# Patient Record
Sex: Female | Born: 1960 | Race: White | Hispanic: No | Marital: Single | Smoking: Never smoker
Health system: Southern US, Community
[De-identification: ages and names within clinical notes are randomized; demographics above are authoritative.]

## PROBLEM LIST (undated history)

## (undated) DIAGNOSIS — E785 Hyperlipidemia, unspecified: Secondary | ICD-10-CM

## (undated) HISTORY — PX: BACK SURGERY: SHX140

## (undated) HISTORY — PX: CHOLECYSTECTOMY: SHX55

---

## 2000-07-25 ENCOUNTER — Other Ambulatory Visit: Admission: RE | Admit: 2000-07-25 | Discharge: 2000-07-25 | Payer: Self-pay | Admitting: Internal Medicine

## 2003-11-23 ENCOUNTER — Other Ambulatory Visit: Admission: RE | Admit: 2003-11-23 | Discharge: 2003-11-23 | Payer: Self-pay | Admitting: Obstetrics and Gynecology

## 2004-12-26 ENCOUNTER — Observation Stay (HOSPITAL_COMMUNITY): Admission: RE | Admit: 2004-12-26 | Discharge: 2004-12-27 | Payer: Self-pay | Admitting: Orthopedic Surgery

## 2004-12-26 ENCOUNTER — Encounter (INDEPENDENT_AMBULATORY_CARE_PROVIDER_SITE_OTHER): Payer: Self-pay | Admitting: Specialist

## 2005-06-19 ENCOUNTER — Ambulatory Visit: Payer: Self-pay | Admitting: General Practice

## 2006-01-01 ENCOUNTER — Other Ambulatory Visit: Admission: RE | Admit: 2006-01-01 | Discharge: 2006-01-01 | Payer: Self-pay | Admitting: Family Medicine

## 2006-07-03 ENCOUNTER — Ambulatory Visit: Payer: Self-pay | Admitting: General Practice

## 2007-07-08 ENCOUNTER — Ambulatory Visit: Payer: Self-pay | Admitting: General Practice

## 2007-07-10 ENCOUNTER — Ambulatory Visit: Payer: Self-pay | Admitting: General Practice

## 2008-03-03 ENCOUNTER — Other Ambulatory Visit: Admission: RE | Admit: 2008-03-03 | Discharge: 2008-03-03 | Payer: Self-pay | Admitting: Family Medicine

## 2008-03-15 ENCOUNTER — Ambulatory Visit: Payer: Self-pay | Admitting: General Practice

## 2008-07-14 ENCOUNTER — Ambulatory Visit: Payer: Self-pay | Admitting: General Practice

## 2009-04-26 ENCOUNTER — Other Ambulatory Visit: Admission: RE | Admit: 2009-04-26 | Discharge: 2009-04-26 | Payer: Self-pay | Admitting: Family Medicine

## 2009-07-20 ENCOUNTER — Ambulatory Visit: Payer: Self-pay | Admitting: General Practice

## 2010-07-12 ENCOUNTER — Ambulatory Visit: Payer: Self-pay | Admitting: General Practice

## 2010-11-14 ENCOUNTER — Other Ambulatory Visit: Payer: Self-pay | Admitting: Family Medicine

## 2010-11-14 DIAGNOSIS — R51 Headache: Secondary | ICD-10-CM

## 2010-11-17 ENCOUNTER — Ambulatory Visit
Admission: RE | Admit: 2010-11-17 | Discharge: 2010-11-17 | Disposition: A | Discharge status: Home / Self Care | Payer: BC Managed Care – PPO | Source: Ambulatory Visit | Attending: Family Medicine | Admitting: Family Medicine

## 2010-11-17 DIAGNOSIS — R51 Headache: Secondary | ICD-10-CM

## 2010-11-17 MED ORDER — IOHEXOL 300 MG/ML  SOLN
75.0000 mL | Freq: Once | INTRAMUSCULAR | Status: AC | PRN
  Administered 2010-11-17: 75 mL via INTRAVENOUS

## 2011-01-30 ENCOUNTER — Other Ambulatory Visit (HOSPITAL_COMMUNITY)
Admission: RE | Admit: 2011-01-30 | Discharge: 2011-01-30 | Disposition: A | Discharge status: Home / Self Care | Payer: BC Managed Care – PPO | Source: Ambulatory Visit | Attending: Family Medicine | Admitting: Family Medicine

## 2011-01-30 ENCOUNTER — Other Ambulatory Visit: Payer: Self-pay | Admitting: Family Medicine

## 2011-01-30 DIAGNOSIS — Z1159 Encounter for screening for other viral diseases: Secondary | ICD-10-CM | POA: Insufficient documentation

## 2011-01-30 DIAGNOSIS — Z124 Encounter for screening for malignant neoplasm of cervix: Secondary | ICD-10-CM | POA: Insufficient documentation

## 2011-02-02 NOTE — Op Note (Signed)
Kimberly Maxwell, Kimberly Maxwell                 ACCOUNT NO.:  [id number]   MEDICAL RECORD NO.:  [id number]          PATIENT TYPE:  AMB   LOCATION:  DAY                          FACILITY:  [facility]   PHYSICIAN:  Marlowe Kays, M.D.  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   OPERATION:  Microdiscectomy and removal of free fragments L3-4 right.   SURGEON:  Marlowe Kays, M.D.   ASSISTANT:  Georges Lynch. Darrelyn Hillock, M.D.   ANESTHESIA:  General anesthesia.   INDICATIONS FOR PROCEDURE:  She has had prolonged back and right leg pain  associated with weakness of her quadriceps and numbness over the pretibial  area.  MRI demonstrated a lateral recess free fragment disc material over  the body of L4 distally.  Because of the pain and neurologic deficit, she is  here today for surgery.   DESCRIPTION OF PROCEDURE:  Prophylactic antibiotics.  Satisfactory general  anesthesia.  Prone position on the [place] frame.  Back was prepped with  DuraPrep and with three spinal needles and a lateral x-ray, tentatively  located the L3-4 interspace.  Continued to drape her back in a sterile  field.  A midline incision based on the original x-ray and isolated what  looked to be the spinous process of L3 and L4.  These were tagged with  Kocher clamps and a second lateral x-ray was taken confirming the L3-4  interspace with a superior clamp pointed at the L3-4 disc.  Based on this, I  dissected the soft tissue off the lamina of L3 and L4.  I used 2 and 3 mm  Kerrison rongeurs to undermine the inferior lamina of L3 and after using a  small curette to undermine the superior portion of L4, used them there as  well, working laterally, in addition to perform the initial  decompression.  I then brought in a microscope which showed significant lateral recess  stenosis and we removed additional bone laterally and performed a wide  foraminotomy.  I then mobilized the meninges and the L4 nerve root medially.  A large amount of disc material was found above and beneath the L4 nerve  root just as depicted on the MRI.  I removed this with nerve hook and  pituitary rongeur.  A number of bleeders were coagulated with bipolar  cautery.  After removing all disc fragments at this location, I then used 3  mm Kerrison rongeur to remove the inferior lamina of L3, I worked up to the  L3-4 disc.  We then retracted the dura at this level.  I opened the disc  space with a 15 knife blade.  We got out a moderate amount of disc material.  It appeared that most of it had already been spitted out as part of the free  fragment. We removed all disc material obtainable.  All bleeders  were  corrected, rechecked with a hockey stick to make sure the L4 nerve root was  lying free in the foramen and that there was no additional disc material  beneath the meninges.  I then irrigated the wound well, placed Gelfoam in  the bed of the wound and closed, removed the self-retaining retractors.  There was no unusual bleeding.  She was given 30 mg of Toradol IV.  The  fascia was closed with interrupted #1 Vicryl, subcutaneous tissue with a  combination of #1 deep 2-0 Vicryl superficially, infiltrated the soft  tissues with 0.5% plain Marcaine and continued the closure with 2-0 Vicryl  superficially and staples in the  skin.  Betadine, Adaptic and dry sterile dressing applied.  She tolerated  the procedure well and was taken to the recovery room in satisfactory  condition with no known complications.   ESTIMATED BLOOD LOSS:  Perhaps 100 mL.  Total blood replacement.      JA/MEDQ  D:  12/26/2004  T:  12/26/2004  Job:  [id number]

## 2011-02-22 ENCOUNTER — Other Ambulatory Visit: Payer: Self-pay | Admitting: Gastroenterology

## 2011-07-11 ENCOUNTER — Ambulatory Visit: Payer: Self-pay

## 2014-12-30 ENCOUNTER — Other Ambulatory Visit: Payer: Self-pay

## 2014-12-30 DIAGNOSIS — Z1231 Encounter for screening mammogram for malignant neoplasm of breast: Secondary | ICD-10-CM

## 2015-01-10 ENCOUNTER — Ambulatory Visit
Admission: RE | Admit: 2015-01-10 | Discharge: 2015-01-10 | Disposition: A | Discharge status: Home / Self Care | Payer: 59 | Source: Ambulatory Visit

## 2015-01-10 DIAGNOSIS — Z1231 Encounter for screening mammogram for malignant neoplasm of breast: Secondary | ICD-10-CM

## 2016-01-02 DIAGNOSIS — E782 Mixed hyperlipidemia: Secondary | ICD-10-CM | POA: Diagnosis not present

## 2016-01-02 DIAGNOSIS — N183 Chronic kidney disease, stage 3 (moderate): Secondary | ICD-10-CM | POA: Diagnosis not present

## 2016-01-02 DIAGNOSIS — I129 Hypertensive chronic kidney disease with stage 1 through stage 4 chronic kidney disease, or unspecified chronic kidney disease: Secondary | ICD-10-CM | POA: Diagnosis not present

## 2016-01-02 DIAGNOSIS — G2581 Restless legs syndrome: Secondary | ICD-10-CM | POA: Diagnosis not present

## 2016-05-11 DIAGNOSIS — R1030 Lower abdominal pain, unspecified: Secondary | ICD-10-CM | POA: Diagnosis not present

## 2016-05-11 DIAGNOSIS — K5792 Diverticulitis of intestine, part unspecified, without perforation or abscess without bleeding: Secondary | ICD-10-CM | POA: Diagnosis not present

## 2016-05-11 DIAGNOSIS — M545 Low back pain: Secondary | ICD-10-CM | POA: Diagnosis not present

## 2016-05-11 DIAGNOSIS — R3129 Other microscopic hematuria: Secondary | ICD-10-CM | POA: Diagnosis not present

## 2016-05-25 ENCOUNTER — Other Ambulatory Visit: Payer: Self-pay | Admitting: Family Medicine

## 2016-05-25 ENCOUNTER — Ambulatory Visit
Admission: RE | Admit: 2016-05-25 | Discharge: 2016-05-25 | Disposition: A | Discharge status: Home / Self Care | Payer: BLUE CROSS/BLUE SHIELD | Source: Ambulatory Visit | Attending: Family Medicine | Admitting: Family Medicine

## 2016-05-25 DIAGNOSIS — R1032 Left lower quadrant pain: Secondary | ICD-10-CM

## 2016-05-25 DIAGNOSIS — K5732 Diverticulitis of large intestine without perforation or abscess without bleeding: Secondary | ICD-10-CM | POA: Diagnosis not present

## 2016-06-01 DIAGNOSIS — M5441 Lumbago with sciatica, right side: Secondary | ICD-10-CM | POA: Diagnosis not present

## 2016-07-30 ENCOUNTER — Other Ambulatory Visit: Payer: Self-pay | Admitting: Family Medicine

## 2016-07-30 ENCOUNTER — Other Ambulatory Visit (HOSPITAL_COMMUNITY)
Admission: RE | Admit: 2016-07-30 | Discharge: 2016-07-30 | Disposition: A | Discharge status: Home / Self Care | Payer: BLUE CROSS/BLUE SHIELD | Source: Ambulatory Visit | Attending: Family Medicine | Admitting: Family Medicine

## 2016-07-30 DIAGNOSIS — Z124 Encounter for screening for malignant neoplasm of cervix: Secondary | ICD-10-CM | POA: Diagnosis not present

## 2016-07-30 DIAGNOSIS — N183 Chronic kidney disease, stage 3 (moderate): Secondary | ICD-10-CM | POA: Diagnosis not present

## 2016-07-30 DIAGNOSIS — Z23 Encounter for immunization: Secondary | ICD-10-CM | POA: Diagnosis not present

## 2016-07-30 DIAGNOSIS — I129 Hypertensive chronic kidney disease with stage 1 through stage 4 chronic kidney disease, or unspecified chronic kidney disease: Secondary | ICD-10-CM | POA: Diagnosis not present

## 2016-07-30 DIAGNOSIS — Z0000 Encounter for general adult medical examination without abnormal findings: Secondary | ICD-10-CM | POA: Diagnosis not present

## 2016-07-30 DIAGNOSIS — K219 Gastro-esophageal reflux disease without esophagitis: Secondary | ICD-10-CM | POA: Diagnosis not present

## 2016-07-30 DIAGNOSIS — E782 Mixed hyperlipidemia: Secondary | ICD-10-CM | POA: Diagnosis not present

## 2016-07-30 DIAGNOSIS — Z01419 Encounter for gynecological examination (general) (routine) without abnormal findings: Secondary | ICD-10-CM | POA: Insufficient documentation

## 2016-08-01 LAB — CYTOLOGY - PAP: DIAGNOSIS: NEGATIVE

## 2016-08-13 DIAGNOSIS — H1851 Endothelial corneal dystrophy: Secondary | ICD-10-CM | POA: Diagnosis not present

## 2016-08-13 DIAGNOSIS — H2513 Age-related nuclear cataract, bilateral: Secondary | ICD-10-CM | POA: Diagnosis not present

## 2016-08-29 DIAGNOSIS — H43813 Vitreous degeneration, bilateral: Secondary | ICD-10-CM | POA: Diagnosis not present

## 2016-08-29 DIAGNOSIS — H2513 Age-related nuclear cataract, bilateral: Secondary | ICD-10-CM | POA: Diagnosis not present

## 2016-08-29 DIAGNOSIS — H353190 Nonexudative age-related macular degeneration, unspecified eye, stage unspecified: Secondary | ICD-10-CM | POA: Diagnosis not present

## 2016-08-29 DIAGNOSIS — H353132 Nonexudative age-related macular degeneration, bilateral, intermediate dry stage: Secondary | ICD-10-CM | POA: Diagnosis not present

## 2016-08-29 DIAGNOSIS — I10 Essential (primary) hypertension: Secondary | ICD-10-CM | POA: Diagnosis not present

## 2016-08-29 DIAGNOSIS — H1851 Endothelial corneal dystrophy: Secondary | ICD-10-CM | POA: Diagnosis not present

## 2016-08-29 DIAGNOSIS — H547 Unspecified visual loss: Secondary | ICD-10-CM | POA: Diagnosis not present

## 2016-08-29 DIAGNOSIS — Z79899 Other long term (current) drug therapy: Secondary | ICD-10-CM | POA: Diagnosis not present

## 2016-10-03 IMAGING — CT CT ABD-PELV W/O CM
2 of 4 series · 16 of 46 positions shown, 18 images · non-contrast
Comparison: None.

CLINICAL DATA: Left lower quadrant pain, right back pain, history
diverticulitis with 3 weeks of antibiotics

EXAM:
CT ABDOMEN AND PELVIS WITHOUT CONTRAST
TECHNIQUE: Multidetector CT imaging of the abdomen and pelvis was performed
following the standard protocol without IV contrast.

[Series 2: abd/pelvis w/(date) · axial · 0.76mm/px · z∈[-451,+34]mm · 13 of 107 slices shown, 15 images]
[im 5/107  soft-tissue]
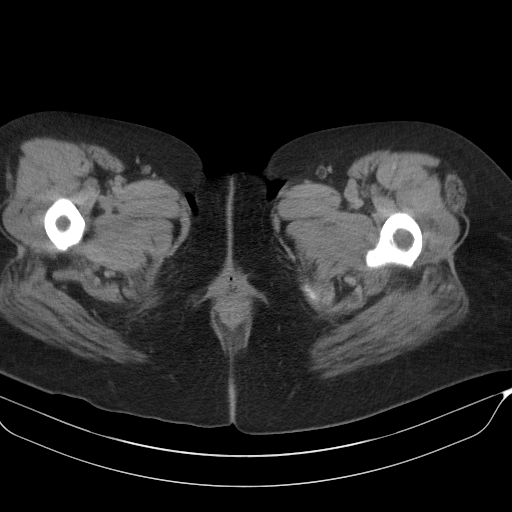
[im 5/107  bone]
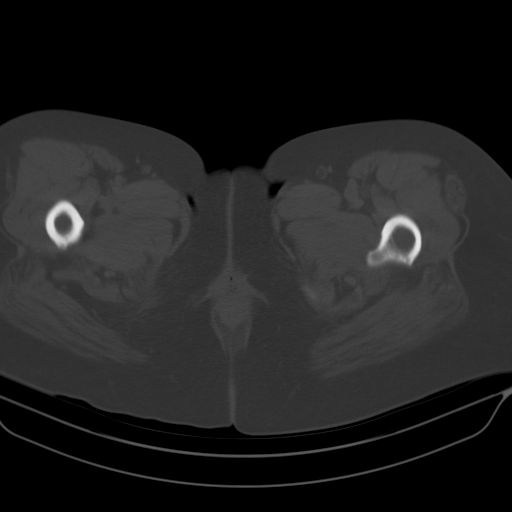
[im 13/107  soft-tissue]
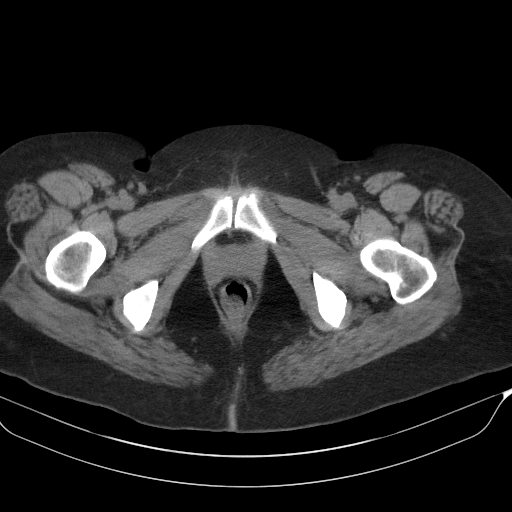
[im 22/107  soft-tissue]
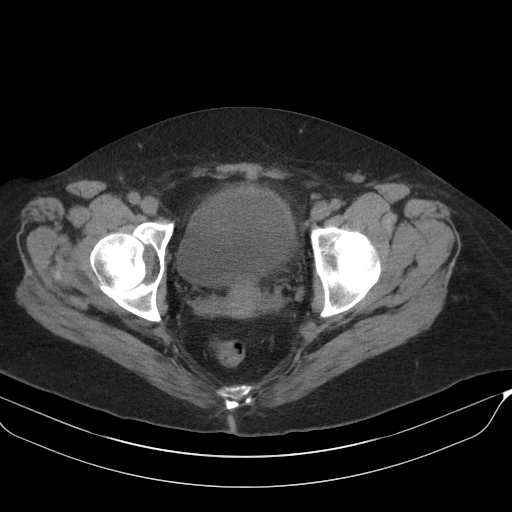
[im 30/107  soft-tissue]
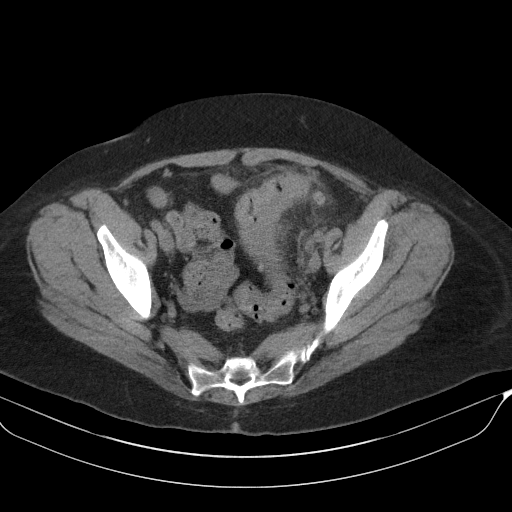
[im 39/107  soft-tissue]
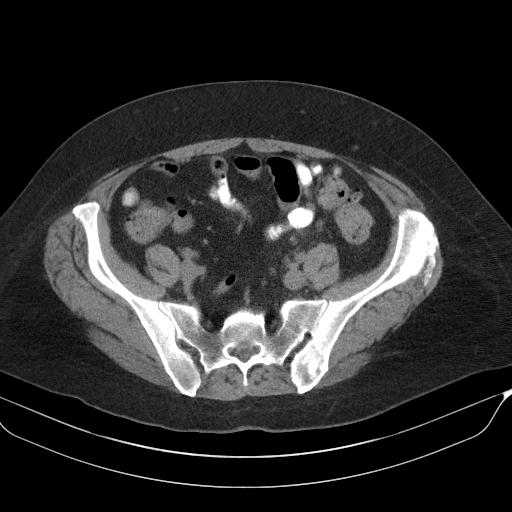
[im 47/107  soft-tissue]
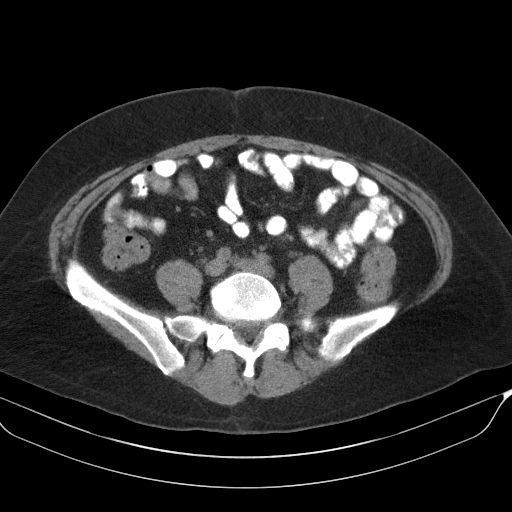
[im 56/107  soft-tissue]
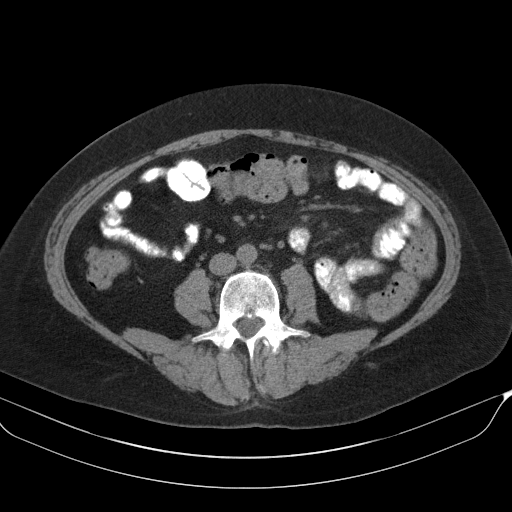
[im 60/107  soft-tissue]
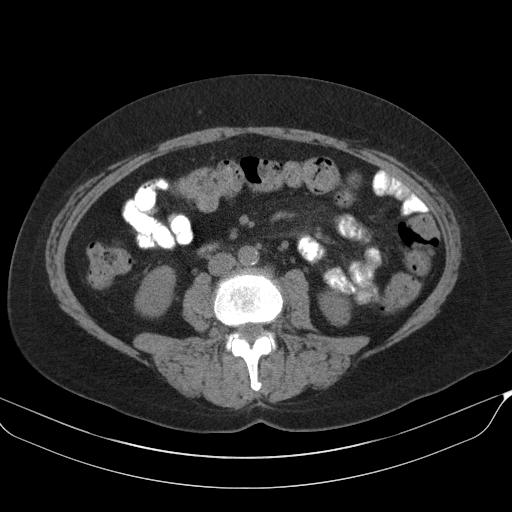
[im 68/107  soft-tissue]
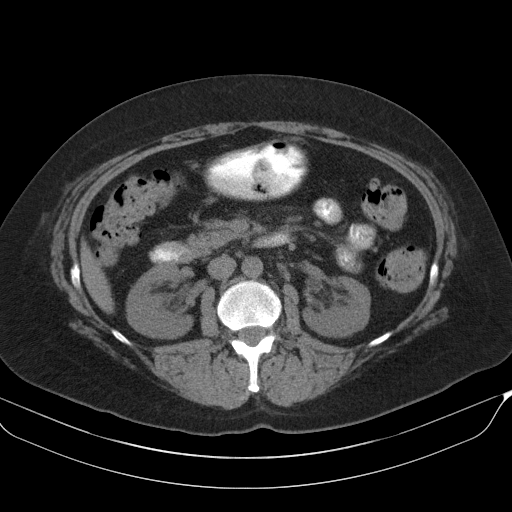
[im 68/107  bone]
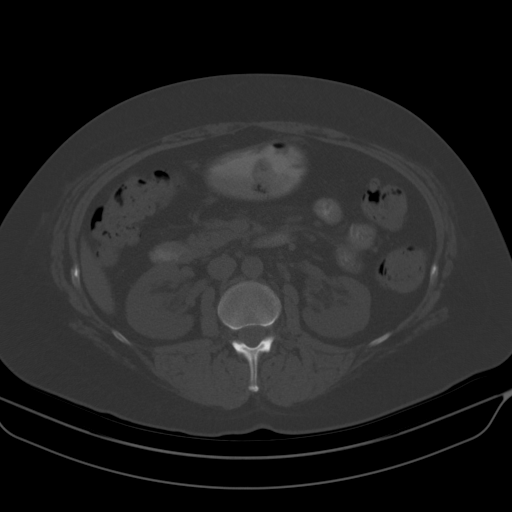
[im 77/107  soft-tissue]
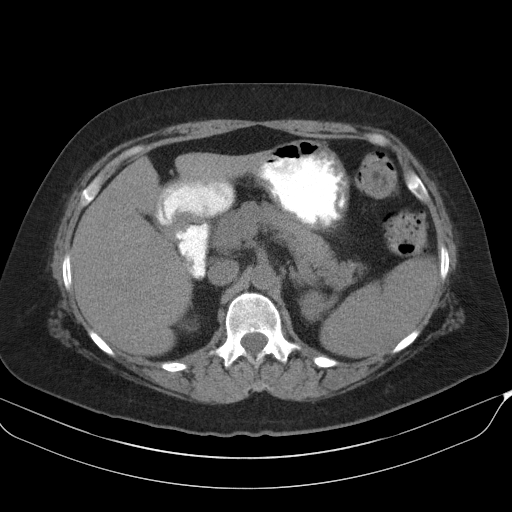
[im 85/107  soft-tissue]
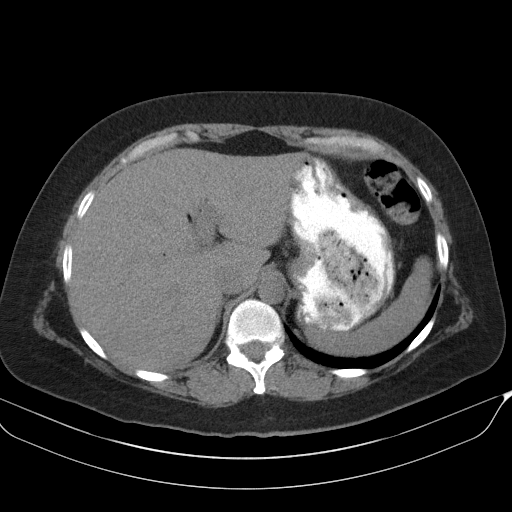
[im 94/107  soft-tissue]
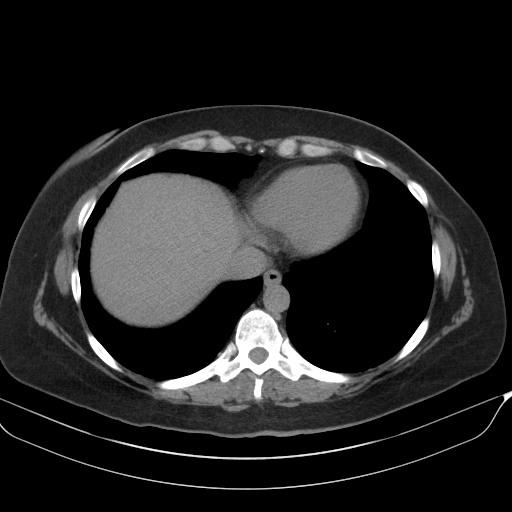
[im 102/107  soft-tissue]
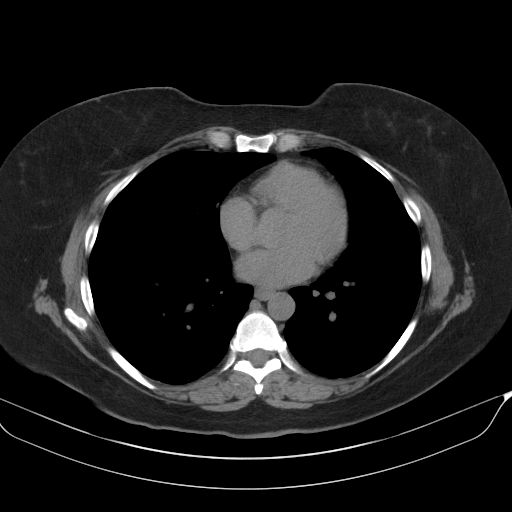

[Series 3: cor · coronal · 0.77mm/px · 3 of 82 slices shown]
[im 28/82  soft-tissue]
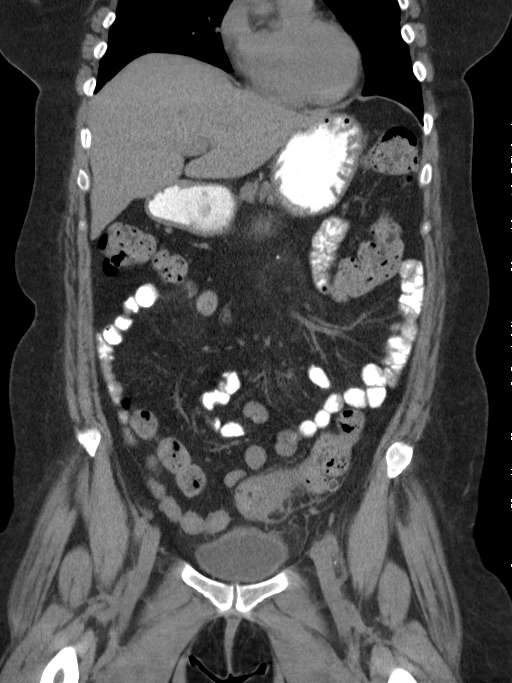
[im 37/82  soft-tissue]
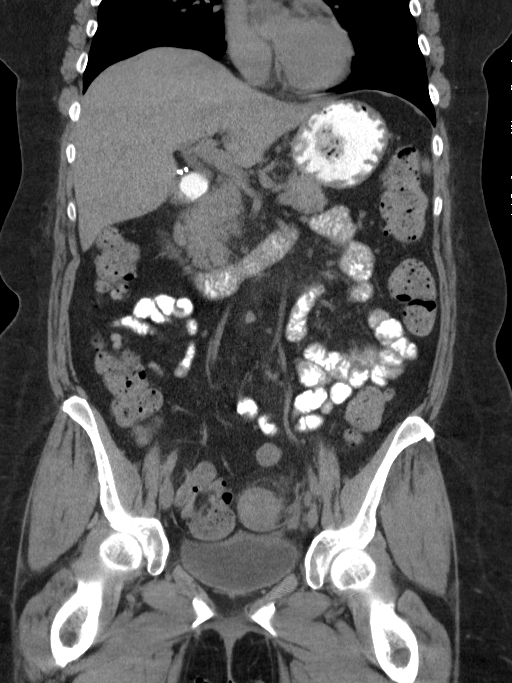
[im 46/82  soft-tissue]
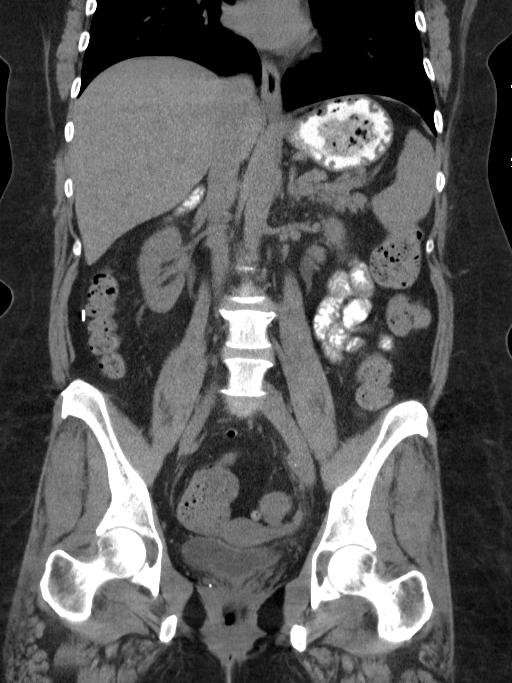

[16 of 46 positions shown; findings below may reference images not displayed]

FINDINGS: Lower chest: No acute abnormality.

Hepatobiliary: No focal liver abnormality is seen. Status post
cholecystectomy. No biliary dilatation.

Pancreas: Unremarkable. No pancreatic ductal dilatation or
surrounding inflammatory changes.

Spleen: Normal in size without focal abnormality.

Adrenals/Urinary Tract: Adrenal glands are unremarkable. Kidneys are
normal, without renal calculi, focal lesion, or hydronephrosis.
Bladder is unremarkable.

Stomach/Bowel: No bowel dilatation. Diverticulosis of the sigmoid
colon with bowel wall thickening and pericolonic inflammatory
changes most consistent with acute diverticulitis. No
peridiverticular abscess. No pneumatosis, pneumoperitoneum or portal
venous gas. No abdominal or pelvic free fluid.

Vascular/Lymphatic: Abdominal aortic atherosclerosis. Normal caliber
abdominal aorta. No lymphadenopathy.

Reproductive: Uterus and bilateral adnexa are unremarkable.

Other: No abdominal wall hernia or abnormality. No abdominopelvic
ascites.

Musculoskeletal: No acute or significant osseous findings. No lytic
or sclerotic osseous lesion.
IMPRESSION: 1. Acute sigmoid diverticulitis.  No peridiverticular abscess.

## 2016-12-03 DIAGNOSIS — A4901 Methicillin susceptible Staphylococcus aureus infection, unspecified site: Secondary | ICD-10-CM | POA: Diagnosis not present

## 2016-12-03 DIAGNOSIS — R110 Nausea: Secondary | ICD-10-CM | POA: Diagnosis not present

## 2016-12-03 DIAGNOSIS — Z139 Encounter for screening, unspecified: Secondary | ICD-10-CM | POA: Diagnosis not present

## 2016-12-10 DIAGNOSIS — H1851 Endothelial corneal dystrophy: Secondary | ICD-10-CM | POA: Diagnosis not present

## 2016-12-10 DIAGNOSIS — H353131 Nonexudative age-related macular degeneration, bilateral, early dry stage: Secondary | ICD-10-CM | POA: Diagnosis not present

## 2016-12-10 DIAGNOSIS — H04123 Dry eye syndrome of bilateral lacrimal glands: Secondary | ICD-10-CM | POA: Diagnosis not present

## 2016-12-10 DIAGNOSIS — H2513 Age-related nuclear cataract, bilateral: Secondary | ICD-10-CM | POA: Diagnosis not present

## 2016-12-28 DIAGNOSIS — B957 Other staphylococcus as the cause of diseases classified elsewhere: Secondary | ICD-10-CM | POA: Diagnosis not present

## 2016-12-28 DIAGNOSIS — S21001D Unspecified open wound of right breast, subsequent encounter: Secondary | ICD-10-CM | POA: Diagnosis not present

## 2017-01-21 DIAGNOSIS — H43813 Vitreous degeneration, bilateral: Secondary | ICD-10-CM | POA: Diagnosis not present

## 2017-01-21 DIAGNOSIS — H2513 Age-related nuclear cataract, bilateral: Secondary | ICD-10-CM | POA: Diagnosis not present

## 2017-01-21 DIAGNOSIS — H04123 Dry eye syndrome of bilateral lacrimal glands: Secondary | ICD-10-CM | POA: Diagnosis not present

## 2017-01-21 DIAGNOSIS — H3554 Dystrophies primarily involving the retinal pigment epithelium: Secondary | ICD-10-CM | POA: Diagnosis not present

## 2017-01-21 DIAGNOSIS — H1851 Endothelial corneal dystrophy: Secondary | ICD-10-CM | POA: Diagnosis not present

## 2017-01-28 DIAGNOSIS — H53413 Scotoma involving central area, bilateral: Secondary | ICD-10-CM | POA: Diagnosis not present

## 2017-01-28 DIAGNOSIS — H3554 Dystrophies primarily involving the retinal pigment epithelium: Secondary | ICD-10-CM | POA: Diagnosis not present

## 2017-03-18 DIAGNOSIS — I129 Hypertensive chronic kidney disease with stage 1 through stage 4 chronic kidney disease, or unspecified chronic kidney disease: Secondary | ICD-10-CM | POA: Diagnosis not present

## 2017-03-18 DIAGNOSIS — E782 Mixed hyperlipidemia: Secondary | ICD-10-CM | POA: Diagnosis not present

## 2017-03-18 DIAGNOSIS — Z1231 Encounter for screening mammogram for malignant neoplasm of breast: Secondary | ICD-10-CM | POA: Diagnosis not present

## 2017-03-18 DIAGNOSIS — N183 Chronic kidney disease, stage 3 (moderate): Secondary | ICD-10-CM | POA: Diagnosis not present

## 2017-03-18 DIAGNOSIS — G2581 Restless legs syndrome: Secondary | ICD-10-CM | POA: Diagnosis not present

## 2017-04-01 DIAGNOSIS — Z1231 Encounter for screening mammogram for malignant neoplasm of breast: Secondary | ICD-10-CM | POA: Diagnosis not present

## 2017-06-17 DIAGNOSIS — R1032 Left lower quadrant pain: Secondary | ICD-10-CM | POA: Diagnosis not present

## 2017-06-17 DIAGNOSIS — K5900 Constipation, unspecified: Secondary | ICD-10-CM | POA: Diagnosis not present

## 2017-08-19 DIAGNOSIS — Z23 Encounter for immunization: Secondary | ICD-10-CM | POA: Diagnosis not present

## 2017-09-30 DIAGNOSIS — I129 Hypertensive chronic kidney disease with stage 1 through stage 4 chronic kidney disease, or unspecified chronic kidney disease: Secondary | ICD-10-CM | POA: Diagnosis not present

## 2017-09-30 DIAGNOSIS — E782 Mixed hyperlipidemia: Secondary | ICD-10-CM | POA: Diagnosis not present

## 2017-09-30 DIAGNOSIS — N183 Chronic kidney disease, stage 3 (moderate): Secondary | ICD-10-CM | POA: Diagnosis not present

## 2017-11-18 DIAGNOSIS — H353131 Nonexudative age-related macular degeneration, bilateral, early dry stage: Secondary | ICD-10-CM | POA: Diagnosis not present

## 2017-11-18 DIAGNOSIS — H1851 Endothelial corneal dystrophy: Secondary | ICD-10-CM | POA: Diagnosis not present

## 2017-11-18 DIAGNOSIS — H04123 Dry eye syndrome of bilateral lacrimal glands: Secondary | ICD-10-CM | POA: Diagnosis not present

## 2018-03-31 DIAGNOSIS — E782 Mixed hyperlipidemia: Secondary | ICD-10-CM | POA: Diagnosis not present

## 2018-03-31 DIAGNOSIS — I129 Hypertensive chronic kidney disease with stage 1 through stage 4 chronic kidney disease, or unspecified chronic kidney disease: Secondary | ICD-10-CM | POA: Diagnosis not present

## 2018-04-22 DIAGNOSIS — I951 Orthostatic hypotension: Secondary | ICD-10-CM | POA: Diagnosis not present

## 2018-05-02 DIAGNOSIS — Z1231 Encounter for screening mammogram for malignant neoplasm of breast: Secondary | ICD-10-CM | POA: Diagnosis not present

## 2018-07-28 DIAGNOSIS — H3554 Dystrophies primarily involving the retinal pigment epithelium: Secondary | ICD-10-CM | POA: Diagnosis not present

## 2018-07-28 DIAGNOSIS — H353221 Exudative age-related macular degeneration, left eye, with active choroidal neovascularization: Secondary | ICD-10-CM | POA: Diagnosis not present

## 2018-08-01 DIAGNOSIS — Z23 Encounter for immunization: Secondary | ICD-10-CM | POA: Diagnosis not present

## 2018-08-07 DIAGNOSIS — R5383 Other fatigue: Secondary | ICD-10-CM | POA: Diagnosis not present

## 2018-08-07 DIAGNOSIS — J020 Streptococcal pharyngitis: Secondary | ICD-10-CM | POA: Diagnosis not present

## 2018-08-07 DIAGNOSIS — R52 Pain, unspecified: Secondary | ICD-10-CM | POA: Diagnosis not present

## 2018-08-28 DIAGNOSIS — H353211 Exudative age-related macular degeneration, right eye, with active choroidal neovascularization: Secondary | ICD-10-CM | POA: Diagnosis not present

## 2022-11-03 ENCOUNTER — Ambulatory Visit (HOSPITAL_COMMUNITY)
Admission: EM | Admit: 2022-11-03 | Discharge: 2022-11-03 | Disposition: A | Discharge status: Home / Self Care | Payer: Commercial Managed Care - HMO | Attending: Emergency Medicine | Admitting: Emergency Medicine

## 2022-11-03 ENCOUNTER — Encounter (HOSPITAL_COMMUNITY): Payer: Self-pay

## 2022-11-03 DIAGNOSIS — J069 Acute upper respiratory infection, unspecified: Secondary | ICD-10-CM | POA: Insufficient documentation

## 2022-11-03 DIAGNOSIS — Z1152 Encounter for screening for COVID-19: Secondary | ICD-10-CM | POA: Insufficient documentation

## 2022-11-03 HISTORY — DX: Hyperlipidemia, unspecified: E78.5

## 2022-11-03 NOTE — ED Triage Notes (Signed)
Patient presenting for Covid test for travel. Patient having slight sore throat. Husband was sick first and he was negative.

## 2022-11-03 NOTE — ED Provider Notes (Signed)
[name]    CSN: [id number] Arrival date & time: 11/03/22  1729      History   Chief Complaint Chief Complaint  Patient presents with   URI    HPI Kimberly Maxwell is a 62 y.o. female.   Patient presents to clinic for COVID-19 testing.  She has had a cough and sore throat since Monday and would like to know her COVID-19 results before travel.  She denies chest pain, shortness of breath, wheezing.  The history is provided by the patient.  URI Presenting symptoms: cough and sore throat   Presenting symptoms: no ear pain, no fatigue and no fever   Associated symptoms: no headaches and no wheezing     Past Medical History:  Diagnosis Date   Hyperlipemia     There are no problems to display for this patient.   Past Surgical History:  Procedure Laterality Date   BACK SURGERY     CHOLECYSTECTOMY      OB History   No obstetric history on file.      Home Medications    Prior to Admission medications   Medication Sig Start Date End Date Taking? Authorizing Provider  clonazePAM (KLONOPIN) 2 MG tablet Take 2 mg by mouth daily.   Yes [provider]  COCONUT OIL PO Take by mouth.   Yes [provider]  esomeprazole (NEXIUM) 20 MG capsule Take by mouth.   Yes [provider]  FLUoxetine (PROZAC) 40 MG capsule Take 80 mg by mouth every morning.   Yes [provider]  magnesium oxide (MAG-OX) 400 MG tablet Take by mouth.   Yes [provider]  melatonin 1 MG TABS tablet Take by mouth.   Yes [provider]  simvastatin (ZOCOR) 20 MG tablet Take 20 mg by mouth daily at 6 PM.   Yes [provider]  traMADol (ULTRAM) 50 MG tablet Take 50 mg by mouth every 6 (six) hours as needed. 08/28/22  Yes [provider]  traZODone (DESYREL) 100 MG tablet Take 100-200 mg by mouth at bedtime.   Yes [provider]    Family History History reviewed. No pertinent family history.  Social  History Social History   Tobacco Use   Smoking status: Never   Smokeless tobacco: Never  Vaping Use   Vaping Use: Never used  Substance Use Topics   Alcohol use: Not Currently   Drug use: Never     Allergies   Patient has no known allergies.   Review of Systems Review of Systems  Constitutional:  Negative for chills, fatigue and fever.  HENT:  Positive for sore throat. Negative for ear pain.   Eyes:  Negative for discharge.  Respiratory:  Positive for cough. Negative for shortness of breath and wheezing.   Cardiovascular:  Negative for chest pain.  Gastrointestinal:  Negative for abdominal pain.  Neurological:  Negative for syncope and headaches.     Physical Exam Triage Vital Signs ED Triage Vitals  Enc Vitals Group     BP 11/03/22 1833 (!) 149/91     Pulse Rate 11/03/22 1833 86     Resp 11/03/22 1833 16     Temp 11/03/22 1833 97.6 F (36.4 C)     Temp Source 11/03/22 1833 Oral     SpO2 11/03/22 1833 95 %     Weight 11/03/22 1832 190 lb (86.2 kg)     Height 11/03/22 1832 5' 6"$  (1.676 m)     Head  Circumference --      Peak Flow --      Pain Score 11/03/22 1828 4     Pain Loc --      Pain Edu? --      Excl. in [place]? --    No data found.  Updated Vital Signs BP (!) 149/91 (BP Location: Left Arm)   Pulse 86   Temp 97.6 F (36.4 C) (Oral)   Resp 16   Ht 5' 6"$  (1.676 m)   Wt 190 lb (86.2 kg)   SpO2 95%   BMI 30.67 kg/m   Visual Acuity Right Eye Distance:   Left Eye Distance:   Bilateral Distance:    Right Eye Near:   Left Eye Near:    Bilateral Near:     Physical Exam Vitals and nursing note reviewed.  Constitutional:      Appearance: Normal appearance.     Comments: Pleasant 62 year old female who appears stated age.  HENT:     Head: Normocephalic and atraumatic.     Right Ear: External ear normal.     Left Ear: External ear normal.     Nose: Rhinorrhea present.     Mouth/Throat:     Mouth: Mucous membranes are moist.     Pharynx: Uvula  midline. Posterior oropharyngeal erythema present. No oropharyngeal exudate or uvula swelling.     Tonsils: No tonsillar exudate or tonsillar abscesses.  Eyes:     General: Lids are normal.  Cardiovascular:     Rate and Rhythm: Normal rate and regular rhythm.     Pulses: Normal pulses.     Heart sounds: Normal heart sounds, S1 normal and S2 normal.  Pulmonary:     Effort: Pulmonary effort is normal. No respiratory distress.     Breath sounds: Normal breath sounds. No wheezing.     Comments: Lungs vesicular posteriorly. Musculoskeletal:        General: Normal range of motion.  Neurological:     Mental Status: She is alert.  Psychiatric:        Behavior: Behavior is cooperative.      UC Treatments / Results  Labs (all labs ordered are listed, but only abnormal results are displayed) Labs Reviewed  SARS CORONAVIRUS 2 (TAT 6-24 HRS)    EKG   Radiology No results found.  Procedures Procedures (including critical care time)  Medications Ordered in UC Medications - No data to display  Initial Impression / Assessment and Plan / UC Course  I have reviewed the triage vital signs and the nursing notes.  Pertinent labs & imaging results that were available during my care of the patient were reviewed by me and considered in my medical decision making (see chart for details).  Vital signs and nursing note reviewed.  Patient is hemodynamically stable.  Lungs vesicular, posterior.  Next with mild erythema.  Patient presents to clinic requesting COVID-19 testing before travel.  Symptoms started Monday, so patient is out of quarantine window if positive.  Testing obtained today in clinic, will call if positive.  Patient opted to continue over-the-counter treatment with cough drops and symptomatic management.  Plan of care return precautions discussed, patient verbalized understanding.  Final Clinical Impressions(s) / UC Diagnoses   Final diagnoses:  Viral upper respiratory tract  infection with cough     Discharge Instructions      You are seen today for viral upper respiratory infection and we tested you for COVID-19.  These results should be available tomorrow.  We will call if positive.  Please check your MyChart as we do not call if negative.  Please return to clinic or seek immediate care if you develop cough, chest pain, shortness of breath or fever that does not resolve with medication.  You can continue to treat symptomatically with cough drops, Tylenol, ibuprofen as needed.     ED Prescriptions   None    I have reviewed the PDMP during this encounter.   Keileigh Vahey, [place], [place] 11/03/22 [phone]

## 2022-11-03 NOTE — Discharge Instructions (Addendum)
You are seen today for viral upper respiratory infection and we tested you for COVID-19.  These results should be available tomorrow.  We will call if positive.  Please check your MyChart as we do not call if negative.  Please return to clinic or seek immediate care if you develop cough, chest pain, shortness of breath or fever that does not resolve with medication.  You can continue to treat symptomatically with cough drops, Tylenol, ibuprofen as needed.

## 2022-11-04 LAB — SARS CORONAVIRUS 2 (TAT 6-24 HRS): SARS Coronavirus 2: NEGATIVE

## 2024-06-24 ENCOUNTER — Encounter: Admitting: Internal Medicine
